# Patient Record
Sex: Male | Born: 1965 | Hispanic: Yes | Smoking: Never smoker
Health system: Southern US, Community
[De-identification: ages and names within clinical notes are randomized; demographics above are authoritative.]

---

## 2008-10-30 ENCOUNTER — Emergency Department: Payer: Self-pay | Admitting: Emergency Medicine

## 2016-08-18 ENCOUNTER — Emergency Department
Admission: EM | Admit: 2016-08-18 | Discharge: 2016-08-18 | Disposition: A | Discharge status: Home / Self Care | Payer: Self-pay | Attending: Emergency Medicine | Admitting: Emergency Medicine

## 2016-08-18 DIAGNOSIS — R252 Cramp and spasm: Secondary | ICD-10-CM | POA: Insufficient documentation

## 2016-08-18 LAB — COMPREHENSIVE METABOLIC PANEL
ALK PHOS: 67 U/L (ref 38–126)
ALT: 41 U/L (ref 17–63)
AST: 34 U/L (ref 15–41)
Albumin: 4 g/dL (ref 3.5–5.0)
Anion gap: 7 (ref 5–15)
BUN: 13 mg/dL (ref 6–20)
CHLORIDE: 107 mmol/L (ref 101–111)
CO2: 25 mmol/L (ref 22–32)
CREATININE: 1.01 mg/dL (ref 0.61–1.24)
Calcium: 8.3 mg/dL — ABNORMAL LOW (ref 8.9–10.3)
GFR calc Af Amer: >60 mL/min (ref >60)
Glucose, Bld: 128 mg/dL — ABNORMAL HIGH (ref 65–99)
Potassium: 3.8 mmol/L (ref 3.5–5.1)
Sodium: 139 mmol/L (ref 135–145)
Total Bilirubin: 0.3 mg/dL (ref 0.3–1.2)
Total Protein: 7 g/dL (ref 6.5–8.1)

## 2016-08-18 LAB — URINALYSIS, COMPLETE (UACMP) WITH MICROSCOPIC
Bacteria, UA: NONE SEEN
Bilirubin Urine: NEGATIVE
Glucose, UA: NEGATIVE mg/dL
Ketones, ur: NEGATIVE mg/dL
LEUKOCYTES UA: NEGATIVE
Nitrite: NEGATIVE
PH: 5 (ref 5.0–8.0)
Protein, ur: NEGATIVE mg/dL
SPECIFIC GRAVITY, URINE: 1.024 (ref 1.005–1.030)

## 2016-08-18 LAB — CBC
HCT: 43.3 % (ref 40.0–52.0)
Hemoglobin: 14.7 g/dL (ref 13.0–18.0)
MCH: 31 pg (ref 26.0–34.0)
MCHC: 33.9 g/dL (ref 32.0–36.0)
MCV: 91.5 fL (ref 80.0–100.0)
PLATELETS: 186 10*3/uL (ref 150–440)
RBC: 4.74 MIL/uL (ref 4.40–5.90)
RDW: 12.3 % (ref 11.5–14.5)
WBC: 4.9 10*3/uL (ref 3.8–10.6)

## 2016-08-18 LAB — LIPASE, BLOOD: LIPASE: 34 U/L (ref 11–51)

## 2016-08-18 MED ORDER — CYCLOBENZAPRINE HCL 7.5 MG PO TABS: 7.5000 mg | ORAL_TABLET | Freq: Three times a day (TID) | ORAL | 0 refills | Status: DC | PRN

## 2016-08-18 MED ORDER — KETOROLAC TROMETHAMINE 30 MG/ML IJ SOLN
30.0000 mg | Freq: Once | INTRAMUSCULAR | Status: AC
  Administered 2016-08-18: 30 mg via INTRAMUSCULAR
  Filled 2016-08-18: qty 1

## 2016-08-18 NOTE — ED Triage Notes (Signed)
Abdominal pain since Friday, pain described as pinching intermittently. Pt alert and oriented X4, active, cooperative, pt in NAD. RR even and unlabored, color WNL.

## 2016-08-18 NOTE — ED Provider Notes (Signed)
[facility] Emergency Department Provider Note   ____________________________________________    I have reviewed the triage vital signs and the nursing notes.   HISTORY  Chief Complaint "Pain all over "  Interpreter used  HPI Jayr Lupercio is a 51 y.o. male who presents with complaints of pain all over. Patient reports he has pain from his head all the way to his feet. This apparently started over the last 48 hours. He has never had this before. He is not able to specifically isolate where the pain is but says he hurts everywhere. No shortness of breath, no fevers or chills.   History reviewed. No pertinent past medical history.  There are no active problems to display for this patient.   History reviewed. No pertinent surgical history.  Prior to Admission medications   Medication Sig Start Date End Date Taking? Authorizing Provider  cyclobenzaprine (FEXMID) 7.5 MG tablet Take 1 tablet (7.5 mg total) by mouth 3 (three) times daily as needed for muscle spasms. 08/18/16   Jene Every, MD     Allergies Patient has no known allergies.  No family history on file.  Social History Social History  Substance Use Topics  . Smoking status: Never Smoker  . Smokeless tobacco: Not on file  . Alcohol use No    Review of Systems  Constitutional: No fever/chills  Cardiovascular: Denies chest pain. Respiratory: Denies shortness of breath. Gastrointestinal: No nausea, no vomiting.   Genitourinary: Negative for dysuria. Musculoskeletal: As above Skin: Negative for rash. Neurological: Negative for headaches   10-point ROS otherwise negative.  ____________________________________________   PHYSICAL EXAM:  VITAL SIGNS: ED Triage Vitals [08/18/16 1105]  Enc Vitals Group     BP 114/88     Pulse Rate 88     Resp 18     Temp 98 F (36.7 C)     Temp Source Oral     SpO2 95 %     Weight 180 lb (81.6 kg)     Height 5\' 8"  (1.727 m)   Head Circumference      Peak Flow      Pain Score 8     Pain Loc      Pain Edu?      Excl. in GC?     Constitutional: Alert and oriented. No acute distress. Pleasant and interactive Eyes: Conjunctivae are normal.   Nose: No congestion/rhinnorhea. Mouth/Throat: Mucous membranes are moist.   Neck:  Painless ROM Cardiovascular: Normal rate, regular rhythm. Grossly normal heart sounds.  Good peripheral circulation. Respiratory: Normal respiratory effort.  No retractions. Lungs CTAB. Gastrointestinal: Soft and nontender. No distention.  No CVA tenderness. Genitourinary: deferred Musculoskeletal: No lower extremity tenderness nor edema.  Warm and well perfused Neurologic:  Normal speech and language. No gross focal neurologic deficits are appreciated.  Skin:  Skin is warm, dry and intact. No rash noted. Psychiatric: Mood and affect are normal. Speech and behavior are normal.  ____________________________________________   LABS (all labs ordered are listed, but only abnormal results are displayed)  Labs Reviewed  COMPREHENSIVE METABOLIC PANEL - Abnormal; Notable for the following:       Result Value   Glucose, Bld 128 (*)    Calcium 8.3 (*)    All other components within normal limits  URINALYSIS, COMPLETE (UACMP) WITH MICROSCOPIC - Abnormal; Notable for the following:    Color, Urine YELLOW (*)    APPearance CLEAR (*)    Hgb urine dipstick SMALL (*)  Squamous Epithelial / LPF 0-5 (*)    All other components within normal limits  LIPASE, BLOOD  CBC   ____________________________________________  EKG  ED ECG REPORT I, Jene EveryKINNER, Kmarion Rawl, the attending physician, personally viewed and interpreted this ECG.  Date: 08/18/2016  Rate: 82 Rhythm: normal sinus rhythm QRS Axis: normal Intervals: normal ST/T Wave abnormalities: normal Conduction Disturbances: none Narrative Interpretation:  unremarkable  ____________________________________________  RADIOLOGY  None ____________________________________________   PROCEDURES  Procedure(s) performed: No    Critical Care performed: No ____________________________________________   INITIAL IMPRESSION / ASSESSMENT AND PLAN / ED COURSE  Pertinent labs & imaging results that were available during my care of the patient were reviewed by me and considered in my medical decision making (see chart for details).  Patient presents with complaints of leg pain all over. His exam is benign. Lab work is unremarkable. Vitals are normal. We will trial course of muscle relaxers and have the patient follow-up with PCP. Return precautions discussed.    ____________________________________________   FINAL CLINICAL IMPRESSION(S) / ED DIAGNOSES  Final diagnoses:  Muscle cramps      NEW MEDICATIONS STARTED DURING THIS VISIT:  Discharge Medication List as of 08/18/2016  1:05 PM    START taking these medications   Details  cyclobenzaprine (FEXMID) 7.5 MG tablet Take 1 tablet (7.5 mg total) by mouth 3 (three) times daily as needed for muscle spasms., Starting Tue 08/18/2016, Print         Note:  This document was prepared using Dragon voice recognition software and may include unintentional dictation errors.    Jene Everyobert Tasha Diaz, MD 08/18/16 1321

## 2016-08-18 NOTE — ED Notes (Signed)
Interpreter used for triage.  

## 2017-12-27 ENCOUNTER — Emergency Department: Payer: Self-pay

## 2017-12-27 ENCOUNTER — Encounter: Payer: Self-pay | Admitting: Emergency Medicine

## 2017-12-27 ENCOUNTER — Emergency Department
Admission: EM | Admit: 2017-12-27 | Discharge: 2017-12-28 | Disposition: A | Discharge status: Home / Self Care | Payer: Self-pay | Attending: Student in an Organized Health Care Education/Training Program | Admitting: Student in an Organized Health Care Education/Training Program

## 2017-12-27 DIAGNOSIS — M545 Low back pain, unspecified: Secondary | ICD-10-CM

## 2017-12-27 DIAGNOSIS — R1031 Right lower quadrant pain: Secondary | ICD-10-CM

## 2017-12-27 DIAGNOSIS — T148XXA Other injury of unspecified body region, initial encounter: Secondary | ICD-10-CM

## 2017-12-27 LAB — CBC
HCT: 39.5 % — ABNORMAL LOW (ref 40.0–52.0)
Hemoglobin: 13.3 g/dL (ref 13.0–18.0)
MCH: 30.5 pg (ref 26.0–34.0)
MCHC: 33.7 g/dL (ref 32.0–36.0)
MCV: 90.6 fL (ref 80.0–100.0)
PLATELETS: 312 10*3/uL (ref 150–440)
RBC: 4.35 MIL/uL — ABNORMAL LOW (ref 4.40–5.90)
RDW: 12.6 % (ref 11.5–14.5)
WBC: 13.1 10*3/uL — AB (ref 3.8–10.6)

## 2017-12-27 LAB — URINALYSIS, COMPLETE (UACMP) WITH MICROSCOPIC
BILIRUBIN URINE: NEGATIVE
Bacteria, UA: NONE SEEN
Glucose, UA: NEGATIVE mg/dL
HGB URINE DIPSTICK: NEGATIVE
KETONES UR: NEGATIVE mg/dL
LEUKOCYTES UA: NEGATIVE
NITRITE: NEGATIVE
Protein, ur: NEGATIVE mg/dL
SPECIFIC GRAVITY, URINE: 1.005 (ref 1.005–1.030)
pH: 6 (ref 5.0–8.0)

## 2017-12-27 LAB — BASIC METABOLIC PANEL
Anion gap: 9 (ref 5–15)
BUN: 17 mg/dL (ref 6–20)
CHLORIDE: 103 mmol/L (ref 101–111)
CO2: 26 mmol/L (ref 22–32)
CREATININE: 1.07 mg/dL (ref 0.61–1.24)
Calcium: 9.1 mg/dL (ref 8.9–10.3)
GFR calc Af Amer: >60 mL/min (ref >60)
GFR calc non Af Amer: >60 mL/min (ref >60)
Glucose, Bld: 97 mg/dL (ref 65–99)
Potassium: 3.6 mmol/L (ref 3.5–5.1)
SODIUM: 138 mmol/L (ref 135–145)

## 2017-12-27 MED ORDER — ONDANSETRON HCL 4 MG/2ML IJ SOLN
4.0000 mg | Freq: Once | INTRAMUSCULAR | Status: AC
  Administered 2017-12-27: 4 mg via INTRAVENOUS
  Filled 2017-12-27: qty 2

## 2017-12-27 MED ORDER — IOHEXOL 300 MG/ML  SOLN
100.0000 mL | Freq: Once | INTRAMUSCULAR | Status: AC | PRN
  Administered 2017-12-27: 100 mL via INTRAVENOUS

## 2017-12-27 MED ORDER — KETOROLAC TROMETHAMINE 30 MG/ML IJ SOLN
15.0000 mg | Freq: Once | INTRAMUSCULAR | Status: AC
  Administered 2017-12-27: 15 mg via INTRAVENOUS
  Filled 2017-12-27: qty 1

## 2017-12-27 MED ORDER — MORPHINE SULFATE (PF) 4 MG/ML IV SOLN
4.0000 mg | INTRAVENOUS | Status: DC | PRN
  Administered 2017-12-27: 4 mg via INTRAVENOUS
  Filled 2017-12-27: qty 1

## 2017-12-27 MED ORDER — SODIUM CHLORIDE 0.9 % IV BOLUS
1000.0000 mL | Freq: Once | INTRAVENOUS | Status: AC
Start: 2017-12-27 — End: 2017-12-28
  Administered 2017-12-27: 1000 mL via INTRAVENOUS

## 2017-12-27 NOTE — ED Provider Notes (Signed)
[facility] Emergency Department Provider Note    First MD Initiated Contact with Patient 12/27/17 2148     (approximate)  I have reviewed the triage vital signs and the nursing notes.   HISTORY  Chief Complaint Back Pain    HPI Kermit Arnette is a 52 y.o. male no significant past medical history presents the ER with 8 days of progressively worsening right flank pain as well as right lower quadrant pain.  No measured fevers.  Has had some nausea with vomiting.  Denies any dysuria or hematuria.  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  Denies any chest pain or shortness of breath.  He rates the pain is moderate to severe.    History reviewed. No pertinent past medical history. History reviewed. No pertinent family history. History reviewed. No pertinent surgical history. There are no active problems to display for this patient.     Prior to Admission medications   Medication Sig Start Date End Date Taking? Authorizing Provider  cyclobenzaprine (FEXMID) 7.5 MG tablet Take 1 tablet (7.5 mg total) by mouth 3 (three) times daily as needed for muscle spasms. 08/18/16   Jene Every, MD    Allergies Patient has no known allergies.    Social History Social History   Tobacco Use  . Smoking status: Never Smoker  . Smokeless tobacco: Never Used  Substance Use Topics  . Alcohol use: No  . Drug use: Not on file    Review of Systems Patient denies headaches, rhinorrhea, blurry vision, numbness, shortness of breath, chest pain, edema, cough, abdominal pain, nausea, vomiting, diarrhea, dysuria, fevers, rashes or hallucinations unless otherwise stated above in HPI. ____________________________________________   PHYSICAL EXAM:  VITAL SIGNS: Vitals:   12/27/17 2027  BP: (!) 148/88  Pulse: 78  Resp: 17  Temp: 98 F (36.7 C)  SpO2: 99%    Constitutional: Alert and oriented.  Eyes:  Conjunctivae are normal.  Head: Atraumatic. Nose: No congestion/rhinnorhea. Mouth/Throat: Mucous membranes are moist.   Neck: No stridor. Painless ROM.  Cardiovascular: Normal rate, regular rhythm. Grossly normal heart sounds.  Good peripheral circulation. Respiratory: Normal respiratory effort.  No retractions. Lungs CTAB. Gastrointestinal: Soft with ttp in RLQ. No distention. No abdominal bruits. + right CVA tenderness. Genitourinary: deferred Musculoskeletal: No lower extremity tenderness nor edema.  No joint effusions. Neurologic:  Normal speech and language. No gross focal neurologic deficits are appreciated. No facial droop Skin:  Skin is warm, dry and intact. No rash noted. Psychiatric: Mood and affect are normal. Speech and behavior are normal.  ____________________________________________   LABS (all labs ordered are listed, but only abnormal results are displayed)  Results for orders placed or performed during the hospital encounter of 12/27/17 (from the past 24 hour(s))  Urinalysis, Complete w Microscopic     Status: Abnormal   Collection Time: 12/27/17  8:32 PM  Result Value Ref Range   Color, Urine STRAW (A) YELLOW   APPearance CLEAR (A) CLEAR   Specific Gravity, Urine 1.005 1.005 - 1.030   pH 6.0 5.0 - 8.0   Glucose, UA NEGATIVE NEGATIVE mg/dL   Hgb urine dipstick NEGATIVE NEGATIVE   Bilirubin Urine NEGATIVE NEGATIVE   Ketones, ur NEGATIVE NEGATIVE mg/dL   Protein, ur NEGATIVE NEGATIVE mg/dL   Nitrite NEGATIVE NEGATIVE   Leukocytes, UA NEGATIVE NEGATIVE   RBC / HPF 0-5 0 - 5 RBC/hpf   WBC, UA 0-5 0 - 5 WBC/hpf  Bacteria, UA NONE SEEN NONE SEEN   Squamous Epithelial / LPF 0-5 0 - 5  Basic metabolic panel     Status: None   Collection Time: 12/27/17  8:33 PM  Result Value Ref Range   Sodium 138 135 - 145 mmol/L   Potassium 3.6 3.5 - 5.1 mmol/L   Chloride 103 101 - 111 mmol/L   CO2 26 22 - 32 mmol/L   Glucose, Bld 97 65 - 99 mg/dL   BUN 17 6 - 20 mg/dL    Creatinine, Ser 1.61 0.61 - 1.24 mg/dL   Calcium 9.1 8.9 - 09.6 mg/dL   GFR calc non Af Amer >60 >60 mL/min   GFR calc Af Amer >60 >60 mL/min   Anion gap 9 5 - 15  CBC     Status: Abnormal   Collection Time: 12/27/17  8:33 PM  Result Value Ref Range   WBC 13.1 (H) 3.8 - 10.6 K/uL   RBC 4.35 (L) 4.40 - 5.90 MIL/uL   Hemoglobin 13.3 13.0 - 18.0 g/dL   HCT 04.5 (L) 40.9 - 81.1 %   MCV 90.6 80.0 - 100.0 fL   MCH 30.5 26.0 - 34.0 pg   MCHC 33.7 32.0 - 36.0 g/dL   RDW 91.4 78.2 - 95.6 %   Platelets 312 150 - 440 K/uL   ____________________________________________ ____________________________________________  RADIOLOGY  I personally reviewed all radiographic images ordered to evaluate for the above acute complaints and reviewed radiology reports and findings.  These findings were personally discussed with the patient.  Please see medical record for radiology report.  ____________________________________________   PROCEDURES  Procedure(s) performed:  Procedures    Critical Care performed: no ____________________________________________   INITIAL IMPRESSION / ASSESSMENT AND PLAN / ED COURSE  Pertinent labs & imaging results that were available during my care of the patient were reviewed by me and considered in my medical decision making (see chart for details).   DDX: appy, stone, pyelo, msk strain  Jarvin Ogren is a 52 y.o. who presents to the ED with symptoms as descrbied above.  Patient is AFVSS in ED. Exam as above. Given current presentation have considered the above differential.  Blood work does show evidence of ptosis.  CT imaging will be ordered to evaluate for the above differential.  Will provide IV pain medication and IV fluids.  Patient denies any testicular pain.  Possible stone.  Possible musculoskeletal strain.  Clinical Course as of Dec 28 5  Mon Dec 27, 2017  2350 Patient still with some tenderness on exam.  Does feel improved.  Seems less likely  appendicitis but patient does have white count and no other explanation for his right lower quadrant abdominal pain.  No evidence of UTI or kidney stone.  No symptoms to suggest urethritis or testicular pain.  Spoke with Dr. Maia Plan of general surgery who currently agrees to evaluate patient at bedside.  Patient will be signed out to Dr. sung pending consultation.  Have discussed with the patient and available family all diagnostics and treatments performed thus far and all questions were answered to the best of my ability. The patient demonstrates understanding and agreement with plan.    [PR]    Clinical Course User Index [PR] Willy Eddy, MD     As part of my medical decision making, I reviewed the following data within the electronic MEDICAL RECORD NUMBER Nursing notes reviewed and incorporated, Labs reviewed, notes from prior ED visits and [place] Controlled Substance Database  ____________________________________________   FINAL CLINICAL IMPRESSION(S) / ED DIAGNOSES  Final diagnoses:  Right lower quadrant abdominal pain      NEW MEDICATIONS STARTED DURING THIS VISIT:  New Prescriptions   No medications on file     Note:  This document was prepared using Dragon voice recognition software and may include unintentional dictation errors.    Willy Eddyobinson, Zaiyden Strozier, MD 12/28/17 [phone]

## 2017-12-27 NOTE — ED Triage Notes (Signed)
Pt c/o right lower back pain that radiates into right flank x8 days. Pt denies n/v. Pt denies urinary symptoms.

## 2017-12-28 LAB — CHLAMYDIA/NGC RT PCR (ARMC ONLY)
Chlamydia Tr: NOT DETECTED
N GONORRHOEAE: NOT DETECTED

## 2017-12-28 MED ORDER — IBUPROFEN 800 MG PO TABS: 800.0000 mg | ORAL_TABLET | Freq: Three times a day (TID) | ORAL | 0 refills | Status: DC | PRN

## 2017-12-28 MED ORDER — CYCLOBENZAPRINE HCL 5 MG PO TABS: ORAL_TABLET | ORAL | 0 refills | Status: DC

## 2017-12-28 NOTE — Consult Note (Signed)
SURGICAL CONSULTATION NOTE   HISTORY OF PRESENT ILLNESS (HPI):  52 y.o. male presented to [facility] ED for evaluation of lower back pain. Patient reports started with significant back pain and headache 8 days ago at work. At the end of the day pain was very severe on the right lower back. Patient refers that pain does not radiates to other part of the body. Pain is aggravated with waking and certain movements. If patient is lying flat, pain is "calmed". Patient do refers pain on both abdominal lower quadrant more on the left than on the right. Refers that this pain is not as bad as the back pain and does not stop him from work as the back pain. Patient refers that the lower quadrant pain is not associated with the back pain, which he consider are two different pains. Pain has been improved with pain medication here at the ED (patient does not what medications he has received). Denies nausea. Patient also refers has had diarrhea during the week. Denies fever or chills.  Surgery is consulted by Dr. Roxan Hockeyobinson in this context for evaluation and management of suspected appendicitis.  PAST MEDICAL HISTORY (PMH):  History reviewed. No pertinent past medical history.   PAST SURGICAL HISTORY (PSH):  History reviewed. No pertinent surgical history.   MEDICATIONS:  Prior to Admission medications   Medication Sig Start Date End Date Taking? Authorizing Provider  cyclobenzaprine (FEXMID) 7.5 MG tablet Take 1 tablet (7.5 mg total) by mouth 3 (three) times daily as needed for muscle spasms. 08/18/16   Jene EveryKinner, Robert, MD     ALLERGIES:  No Known Allergies   SOCIAL HISTORY:  Social History   Socioeconomic History  . Marital status: Divorced    Spouse name: Not on file  . Number of children: Not on file  . Years of education: Not on file  . Highest education level: Not on file  Occupational History  . Not on file  Social Needs  . Financial resource strain: Not on file  . Food insecurity:    Worry: Not  on file    Inability: Not on file  . Transportation needs:    Medical: Not on file    Non-medical: Not on file  Tobacco Use  . Smoking status: Never Smoker  . Smokeless tobacco: Never Used  Substance and Sexual Activity  . Alcohol use: No  . Drug use: Not on file  . Sexual activity: Not on file  Lifestyle  . Physical activity:    Days per week: Not on file    Minutes per session: Not on file  . Stress: Not on file  Relationships  . Social connections:    Talks on phone: Not on file    Gets together: Not on file    Attends religious service: Not on file    Active member of club or organization: Not on file    Attends meetings of clubs or organizations: Not on file    Relationship status: Not on file  . Intimate partner violence:    Fear of current or ex partner: Not on file    Emotionally abused: Not on file    Physically abused: Not on file    Forced sexual activity: Not on file  Other Topics Concern  . Not on file  Social History Narrative  . Not on file    The patient currently resides (home / rehab facility / nursing home): Home The patient normally is (ambulatory / bedbound): Ambulatory   FAMILY  HISTORY:  History reviewed. No pertinent family history.   REVIEW OF SYSTEMS:  Constitutional: denies weight loss, fever, chills, or sweats  Eyes: denies any other vision changes, history of eye injury  ENT: denies sore throat, hearing problems  Respiratory: denies shortness of breath, wheezing  Cardiovascular: denies chest pain, palpitations  Gastrointestinal: See HPI Genitourinary: denies burning with urination or urinary frequency Musculoskeletal: denies any other joint pains or cramps. Positive for severe back pain.  Skin: denies any other rashes or skin discolorations  Neurological: denies any other headache, dizziness, weakness  Psychiatric: denies any other depression, anxiety   All other review of systems were negative   VITAL SIGNS:  Temp:  [98 F (36.7  C)] 98 F (36.7 C) (06/10 2027) Pulse Rate:  [78] 78 (06/10 2027) Resp:  [17] 17 (06/10 2027) BP: (148)/(88) 148/88 (06/10 2027) SpO2:  [99 %] 99 % (06/10 2027) Weight:  [81.6 kg (180 lb)] 81.6 kg (180 lb) (06/10 2026)       Weight: 81.6 kg (180 lb)     INTAKE/OUTPUT:  This shift: No intake/output data recorded.  Last 2 shifts: @IOLAST2SHIFTS @   PHYSICAL EXAM:  Constitutional:  -- Normal body habitus  -- Awake, alert, and oriented x3  Eyes:  -- Pupils equally round and reactive to light  -- No scleral icterus  Ear, nose, and throat:  -- No jugular venous distension  Pulmonary:  -- No crackles  -- Equal breath sounds bilaterally -- Breathing non-labored at rest Cardiovascular:  -- S1, S2 present  -- No pericardial rubs Gastrointestinal:  -- Abdomen soft, tender to palpation on both lower quadrants Left > Right, non-distended, no guarding or rebound tenderness -- No abdominal masses appreciated, pulsatile or otherwise  Musculoskeletal and Integumentary:  -- Wounds or skin discoloration: None appreciated -- Extremities: B/L UE and LE FROM, hands and feet warm, no edema -- Tender to palpation on lower back, more on the right than on the left.  Neurologic:  -- Motor function: intact and symmetric -- Sensation: intact and symmetric   Labs:  CBC Latest Ref Rng & Units 12/27/2017 08/18/2016  WBC 3.8 - 10.6 K/uL 13.1(H) 4.9  Hemoglobin 13.0 - 18.0 g/dL 78.2 95.6  Hematocrit 21.3 - 52.0 % 39.5(L) 43.3  Platelets 150 - 440 K/uL 312 186   CMP Latest Ref Rng & Units 12/27/2017 08/18/2016  Glucose 65 - 99 mg/dL 97 086(V)  BUN 6 - 20 mg/dL 17 13  Creatinine 7.84 - 1.24 mg/dL 6.96 2.95  Sodium 284 - 145 mmol/L 138 139  Potassium 3.5 - 5.1 mmol/L 3.6 3.8  Chloride 101 - 111 mmol/L 103 107  CO2 22 - 32 mmol/L 26 25  Calcium 8.9 - 10.3 mg/dL 9.1 1.3(K)  Total Protein 6.5 - 8.1 g/dL - 7.0  Total Bilirubin 0.3 - 1.2 mg/dL - 0.3  Alkaline Phos 38 - 126 U/L - 67  AST 15 - 41 U/L - 34   ALT 17 - 63 U/L - 41   Imaging studies:  CT ABDOMEN AND PELVIS WITH CONTRAST  TECHNIQUE: Multidetector CT imaging of the abdomen and pelvis was performed using the standard protocol following bolus administration of intravenous contrast.  CONTRAST:  OMNIPAQUE IOHEXOL 300 MG/ML  SOLN  COMPARISON:  None.  FINDINGS: Lower chest: Hypoventilatory atelectasis at the lung bases. No pleural fluid.  Hepatobiliary: Decreased hepatic density consistent with steatosis. Focal fatty sparing adjacent the gallbladder fossa. The liver is enlarged spanning 21 cm cranial caudal. No discrete focal  hepatic lesion. Gallbladder partially distended, no calcified gallstone or pericholecystic inflammation. No biliary dilatation.  Pancreas: No ductal dilatation or inflammation.  Spleen: Normal in size without focal abnormality.  Adrenals/Urinary Tract: Normal adrenal glands. No hydronephrosis or perinephric edema. Homogeneous renal enhancement with symmetric excretion on delayed phase imaging. Urinary bladder is physiologically distended without wall thickening. Punctate wall calcification of the bladder dome, likely incidental.  Stomach/Bowel: No bowel wall thickening, inflammatory change or obstruction. Equivocal gastric wall thickening about the greater curvature versus nondistention. Appendix is normal in caliber without periappendiceal inflammation. Intraluminal high density about the appendiceal tip measuring 4 mm and more proximally measuring 8 mm may be enteric contrast patient has had prior contrast-enhanced exam or appendicoliths. Moderate stool burden throughout the colon. No colonic inflammation. No significant diverticular disease.  Vascular/Lymphatic: Minimal aortic atherosclerosis without aneurysm. Portal vein and mesenteric vessels are patent. No acute vascular findings. Prominent right external iliac node measures 10 mm short axis, nonspecific.  Reproductive:  Slight prostate prominence for age, spanning 4.8 cm transverse.  Other: No free air, free fluid, or intra-abdominal fluid collection.  Musculoskeletal: Mild disc space narrowing and endplate spurring at L5-S1. There are no acute or suspicious osseous abnormalities.  IMPRESSION: 1. Equivocal gastric wall thickening about the greater curvature versus nondistention, gastritis is considered. No other acute findings in the abdomen/pelvis. 2. Hepatomegaly and hepatic steatosis. 3. High-density material within otherwise normal appendix may represent barium from a prior radiologic exam or appendicoliths. No periappendiceal inflammation. 4. Prominent right external iliac node measuring 10 mm is nonspecific. In the absence of known malignancy this is likely reactive.  Electronically Signed   By: Rubye Oaks M.D.   On: 12/27/2017 23:26  Images personally reviewed identifying the appendix without fat stranding, no dilation. No bowel obstruction or any pathology that explain the lower quadrant pains, nor the back pain.  Assessment/Plan:  52 y.o. male with lower back pain and lower quadrant pain of 8 days duration. Upon physical exam there is no multiple point of tenderness including left lower quadrant > than right lower quadrant and lower back. Images show the normal appendix without associated fat stranding. Clinical scenario is not consistent with acute appendicitis. Differential diagnosis before appendicitis could be musculoskeletal back pain, gastroenteritis (dirrhea), nephrolithiasis or a combination of problems. Leukocytosis may also be due to dehydration (patient refers has not eaten well this week due to diarrhea). I agree with observation of patient as pain is improving with current pain management and considering other ethiologies of back and abdominal pain.    Gae Gallop, MD

## 2017-12-28 NOTE — ED Provider Notes (Signed)
-----------------------------------------   1:14 AM on 12/28/2017 -----------------------------------------  Patient was evaluated by Dr. Maia Planintron from surgery.  Does not feel patient's symptoms are clinically consistent with appendicitis.  Patient is overall feeling better after IV Toradol.  Awaiting results of GC/chlamydia.   ----------------------------------------- 1:53 AM on 12/28/2017 -----------------------------------------  GC/Chlamydia are negative.  Patient resting no acute distress.  Will prescribe NSAIDs and muscle relaxer and patient will follow-up with his PCP.  Strict return precautions given.  Patient and friend verbalize understanding and agree with plan of care.   Irean HongSung, Kaci Dillie J, MD 12/28/17 [phone]

## 2017-12-28 NOTE — Discharge Instructions (Addendum)
1.  You may take medicines as needed for pain and muscle spasms (Motrin/Flexeril #15). °2.  Apply moist heat to affected area several times daily. °3.  Return to the ER for worsening symptoms, persistent vomiting, difficulty breathing or other concerns. °

## 2017-12-29 ENCOUNTER — Emergency Department: Payer: Self-pay

## 2017-12-29 ENCOUNTER — Other Ambulatory Visit: Payer: Self-pay

## 2017-12-29 ENCOUNTER — Emergency Department
Admission: EM | Admit: 2017-12-29 | Discharge: 2017-12-29 | Disposition: A | Discharge status: Home / Self Care | Payer: Self-pay | Attending: Emergency Medicine | Admitting: Emergency Medicine

## 2017-12-29 ENCOUNTER — Encounter: Payer: Self-pay | Admitting: Emergency Medicine

## 2017-12-29 DIAGNOSIS — Z79899 Other long term (current) drug therapy: Secondary | ICD-10-CM | POA: Insufficient documentation

## 2017-12-29 DIAGNOSIS — M5431 Sciatica, right side: Secondary | ICD-10-CM | POA: Insufficient documentation

## 2017-12-29 MED ORDER — METHYLPREDNISOLONE SODIUM SUCC 125 MG IJ SOLR
80.0000 mg | Freq: Once | INTRAMUSCULAR | Status: AC
  Administered 2017-12-29: 80 mg via INTRAMUSCULAR
  Filled 2017-12-29: qty 2

## 2017-12-29 MED ORDER — TRAMADOL HCL 50 MG PO TABS: 50.0000 mg | ORAL_TABLET | Freq: Two times a day (BID) | ORAL | 0 refills | Status: AC | PRN

## 2017-12-29 MED ORDER — METHYLPREDNISOLONE 4 MG PO TBPK: ORAL_TABLET | ORAL | 0 refills | Status: DC

## 2017-12-29 MED ORDER — HYDROMORPHONE HCL 1 MG/ML IJ SOLN
1.0000 mg | Freq: Once | INTRAMUSCULAR | Status: AC
  Administered 2017-12-29: 1 mg via INTRAMUSCULAR
  Filled 2017-12-29: qty 1

## 2017-12-29 NOTE — ED Provider Notes (Signed)
[facility] Emergency Department Provider Note   ____________________________________________   First MD Initiated Contact with Patient 12/29/17 1218     (approximate)  I have reviewed the triage vital signs and the nursing notes.   HISTORY Via telephonic interpreter. Chief Complaint Back Pain    HPI Lance Fields is a 52 y.o. male patient complain of back pain with radicular component to the right lower extremity for 10 days.  Patient was seen at this facility 2 days ago and had a definitive work-up for abdominal pain.  Patient state he did mention back pain at that time.  Patient was consulted with surgeon who stated there appears to be no acute abdominal process.  Patient state received transit relief with Toradol IM injection.  Patient was discharged prescription for Flexeril and ibuprofen.  Patient stated no relief with these medications.  Patient not return to work secondary to back pain.  Patient denies any abdominal complaints at this time.  Patient rates the pain as a 9/10.  Patient described the pain is "shooting".   History reviewed. No pertinent past medical history.  There are no active problems to display for this patient.   History reviewed. No pertinent surgical history.  Prior to Admission medications   Medication Sig Start Date End Date Taking? Authorizing Provider  cyclobenzaprine (FLEXERIL) 5 MG tablet 1 tablet every 8 hours as he did for muscle spasms 12/28/17   Irean HongSung, Jade J, MD  ibuprofen (ADVIL,MOTRIN) 800 MG tablet Take 1 tablet (800 mg total) by mouth every 8 (eight) hours as needed for moderate pain. 12/28/17   Irean HongSung, Jade J, MD  methylPREDNISolone (MEDROL DOSEPAK) 4 MG TBPK tablet Take Tapered dose as directed 12/29/17   Joni ReiningSmith, Nicholas Trompeter K, PA-C  traMADol (ULTRAM) 50 MG tablet Take 1 tablet (50 mg total) by mouth every 12 (twelve) hours as needed. 12/29/17   Joni ReiningSmith, Idalee Foxworthy K, PA-C    Allergies Patient has no known  allergies.  History reviewed. No pertinent family history.  Social History Social History   Tobacco Use  . Smoking status: Never Smoker  . Smokeless tobacco: Never Used  Substance Use Topics  . Alcohol use: No  . Drug use: Not on file    Review of Systems Constitutional: No fever/chills Eyes: No visual changes. ENT: No sore throat. Cardiovascular: Denies chest pain. Respiratory: Denies shortness of breath. Gastrointestinal: No abdominal pain.  No nausea, no vomiting.  No diarrhea.  No constipation. Genitourinary: Negative for dysuria. Musculoskeletal: Positive for back pain. Skin: Negative for rash. Neurological: Negative for headaches, focal weakness or numbness.   ____________________________________________   PHYSICAL EXAM:  VITAL SIGNS: ED Triage Vitals  Enc Vitals Group     BP 12/29/17 1128 127/73     Pulse Rate 12/29/17 1128 80     Resp 12/29/17 1128 16     Temp 12/29/17 1128 97.9 F (36.6 C)     Temp Source 12/29/17 1128 Oral     SpO2 12/29/17 1128 99 %     Weight 12/29/17 1124 180 lb (81.6 kg)     Height 12/29/17 1124 5\' 8"  (1.727 m)     Head Circumference --      Peak Flow --      Pain Score 12/29/17 1124 9     Pain Loc --      Pain Edu? --      Excl. in GC? --     Constitutional: Alert and oriented.  Moderate distress.   Cardiovascular: Normal  rate, regular rhythm. Grossly normal heart sounds.  Good peripheral circulation. Respiratory: Normal respiratory effort.  No retractions. Lungs CTAB. Gastrointestinal: Soft and nontender. No distention. No abdominal bruits. No CVA tenderness. Musculoskeletal: No obvious spinal deformity.  Patient states in the flex position while standing.  Patient has moderate guarding palpation of L4-S1.  Patient is straight leg test on the right side. Neurologic:  Normal speech and language. No gross focal neurologic deficits are appreciated. No gait instability. Skin:  Skin is warm, dry and intact. No rash  noted. Psychiatric: Mood and affect are normal. Speech and behavior are normal.  ____________________________________________   LABS (all labs ordered are listed, but only abnormal results are displayed)  Labs Reviewed - No data to display ____________________________________________  EKG   ____________________________________________  RADIOLOGY     Official radiology report(s): Dg Lumbar Spine Complete  Result Date: 12/29/2017 CLINICAL DATA:  Right lower back pain radiating to the right leg for 10 days. EXAM: LUMBAR SPINE - COMPLETE 4+ VIEW COMPARISON:  12/27/2017 CT abdomen and pelvis FINDINGS: There are 5 non rib-bearing lumbar type vertebrae. Vertebral alignment is normal. Mild-to-moderate disc space narrowing is present at L5-S1 with associated endplate spurring. Minimal spondylosis is again seen elsewhere in the lumbar spine with preserved disc space heights. Subtle anterior wedging of the L4 vertebral body is unchanged and likely chronic. Moderate L5-S1 facet arthrosis is noted and contributes to bilateral neural foraminal stenosis on CT. IMPRESSION: L5-S1 disc and facet degeneration without evidence of acute osseous abnormality. Electronically Signed   By: Sebastian Ache M.D.   On: 12/29/2017 13:10    ____________________________________________   PROCEDURES  Procedure(s) performed: None  Procedures  Critical Care performed: No  ____________________________________________   INITIAL IMPRESSION / ASSESSMENT AND PLAN / ED COURSE  As part of my medical decision making, I reviewed the following data within the electronic MEDICAL RECORD NUMBER    Radicular back pain secondary to degenerative changes of L4 S1.  Discussed x-ray findings with patient.  Patient given discharge care instruction work note.  Patient advised take medication as directed and [facility]      ____________________________________________   FINAL  CLINICAL IMPRESSION(S) / ED DIAGNOSES  Final diagnoses:  Sciatica of right side     ED Discharge Orders        Ordered    traMADol (ULTRAM) 50 MG tablet  Every 12 hours PRN     12/29/17 1326    methylPREDNISolone (MEDROL DOSEPAK) 4 MG TBPK tablet     12/29/17 1326       Note:  This document was prepared using Dragon voice recognition software and may include unintentional dictation errors.    Joni Reining, PA-C 12/29/17 1328    Sharman Cheek, MD 12/29/17 [phone]

## 2017-12-29 NOTE — ED Triage Notes (Signed)
Pt with c/o back pain. [facility]. Pt does not speak spanish

## 2017-12-29 NOTE — ED Notes (Addendum)
See triage note  [facility]  Was seen here on 6/10   For same  States back pain and headache started about 8 days ago  Also has had some pain into abd  Pain increases with ambulation  No urinary or bowel problems

## 2017-12-29 NOTE — ED Triage Notes (Signed)
Pt reports that he was here on Monday with back pain, they gave him Flexeril and Motrin, states he does not have any relief. Denies any injury, loss of bowel and bladder. Denies any blood in his urine.

## 2017-12-29 NOTE — ED Notes (Signed)
States pain is right lower back and into right leg  Denies any n/v   States pain started about 10 days ago

## 2018-01-17 ENCOUNTER — Emergency Department
Admission: EM | Admit: 2018-01-17 | Discharge: 2018-01-17 | Disposition: A | Discharge status: Home / Self Care | Payer: Self-pay | Attending: Emergency Medicine | Admitting: Emergency Medicine

## 2018-01-17 DIAGNOSIS — M5441 Lumbago with sciatica, right side: Secondary | ICD-10-CM | POA: Insufficient documentation

## 2018-01-17 DIAGNOSIS — G8929 Other chronic pain: Secondary | ICD-10-CM

## 2018-01-17 MED ORDER — ORPHENADRINE CITRATE 30 MG/ML IJ SOLN
60.0000 mg | INTRAMUSCULAR | Status: AC
Start: 2018-01-17 — End: 2018-01-17
  Administered 2018-01-17: 60 mg via INTRAMUSCULAR
  Filled 2018-01-17: qty 2

## 2018-01-17 MED ORDER — KETOROLAC TROMETHAMINE 30 MG/ML IJ SOLN
30.0000 mg | Freq: Once | INTRAMUSCULAR | Status: AC
  Administered 2018-01-17: 30 mg via INTRAMUSCULAR
  Filled 2018-01-17: qty 1

## 2018-01-17 MED ORDER — CYCLOBENZAPRINE HCL 5 MG PO TABS: 5.0000 mg | ORAL_TABLET | Freq: Three times a day (TID) | ORAL | 1 refills | Status: AC | PRN

## 2018-01-17 MED ORDER — DICLOFENAC SODIUM 50 MG PO TBEC: 50.0000 mg | DELAYED_RELEASE_TABLET | Freq: Two times a day (BID) | ORAL | 0 refills | Status: AC

## 2018-01-17 NOTE — ED Notes (Signed)
See triage note  Presents with lower back pain  States pain is mainly on thr right and moves into right leg  Ambulates well to treatment room  Was seen on 6/12 for same  But conts to have pain over the past 7 days   Also has a rash to legs,arms and truck since last Thursday  Positive itching  No resp issues

## 2018-01-17 NOTE — ED Provider Notes (Signed)
[facility] Emergency Department Provider Note ____________________________________________  Time seen: 1015  I have reviewed the triage vital signs and the nursing notes.  HISTORY  Chief Complaint  Back Pain  History limited by Spanish language.  [employer] interpreter Debarah Crape) used for interview and exam.  HPI Lance Fields is a 52 y.o. male presents to the ED, dropped off by family, for evaluation of his chronic intermittent low back pain.  Patient describes pain to the right lumbar sacral region with referral down the right side to the knee.  He denies any recent injury, accident, or trauma.  He was recently evaluated here in the ED for the same complaint, and reports that the prescriptions provided have now run out.  He denies any interim trauma, injury, accident, fall.  He also denies any foot drop, incontinence, or leg weakness.  History reviewed. No pertinent past medical history.  There are no active problems to display for this patient.  History reviewed. No pertinent surgical history.  Prior to Admission medications   Medication Sig Start Date End Date Taking? Authorizing Provider  cyclobenzaprine (FLEXERIL) 5 MG tablet Take 1 tablet (5 mg total) by mouth 3 (three) times daily as needed for muscle spasms. 01/17/18 02/16/18  Mayco Walrond, Charlesetta Ivory, PA-C  diclofenac (VOLTAREN) 50 MG EC tablet Take 1 tablet (50 mg total) by mouth 2 (two) times daily. 01/17/18 02/16/18  Gearlene Godsil, Charlesetta Ivory, PA-C  traMADol (ULTRAM) 50 MG tablet Take 1 tablet (50 mg total) by mouth every 12 (twelve) hours as needed. 12/29/17   Joni Reining, PA-C    Allergies Patient has no known allergies.  No family history on file.  Social History Social History   Tobacco Use  . Smoking status: Never Smoker  . Smokeless tobacco: Never Used  Substance Use Topics  . Alcohol use: No  . Drug use: Not on file    Review of Systems  Constitutional: Negative for  fever. Cardiovascular: Negative for chest pain. Respiratory: Negative for shortness of breath. Gastrointestinal: Negative for abdominal pain, vomiting and diarrhea. Genitourinary: Negative for dysuria. Musculoskeletal: Positive for back pain. Skin: Negative for rash. Neurological: Negative for headaches, focal weakness or numbness. ____________________________________________  PHYSICAL EXAM:  VITAL SIGNS: ED Triage Vitals [01/17/18 0943]  Enc Vitals Group     BP (!) 155/79     Pulse Rate 76     Resp 14     Temp 98.2 F (36.8 C)     Temp Source Oral     SpO2 97 %     Weight 180 lb (81.6 kg)     Height 5\' 8"  (1.727 m)     Head Circumference      Peak Flow      Pain Score 7     Pain Loc      Pain Edu?      Excl. in GC?     Constitutional: Alert and oriented. Well appearing and in no distress. Head: Normocephalic and atraumatic. Cardiovascular: Normal rate, regular rhythm. Normal distal pulses. Respiratory: Normal respiratory effort. No wheezes/rales/rhonchi. Gastrointestinal: Soft and nontender. No distention. Musculoskeletal: Normal spinal alignment without midline tenderness, spasm, deformity, step-off. Nontender with normal range of motion in all extremities.  Neurologic: Nerves II through XII grossly intact.  Negative seated straight leg raise.  Normal toe/heel walk on exam.  Negative Trendelenburg.  Normal gait without ataxia. Normal speech and language. No gross focal neurologic deficits are appreciated. Skin:  Skin is warm, dry and intact.  No rash noted. ____________________________________________  PROCEDURES  Procedures Toradol 30 mg IM Norflex 60 mg IM ____________________________________________  INITIAL IMPRESSION / ASSESSMENT AND PLAN / ED COURSE  Patient with ED evaluation of chronic low back pain.  Patient with a history of sciatica presents for further evaluation and management.  Exam is benign overall shows no acute neuromuscular damage.  He will be  discharged with prescriptions for cyclobenzaprine and diclofenac dose as directed.  He is referred to follow-up with 1 of the local community clinics for ongoing symptoms.  Return precautions have been reviewed. ____________________________________________  FINAL CLINICAL IMPRESSION(S) / ED DIAGNOSES  Final diagnoses:  Chronic right-sided low back pain with right-sided sciatica      Lissa HoardMenshew, Daryan Cagley V Bacon, PA-C 01/17/18 1857    Emily FilbertWilliams, Jonathan E, MD 01/18/18 [phone]

## 2018-01-17 NOTE — Discharge Instructions (Addendum)
Tome los medicamentos recetados segn las indicaciones. Haga un seguimiento con una de las clnicas de la comunidad local en la lista provista.  Take the prescription meds as directed. Follow-up with one of the local community clinics on the list provided.

## 2018-01-17 NOTE — ED Notes (Addendum)
First Nurse Note:  Patient does not speak English, Nurse, children'sVirtual Interpreter used to ascertain that patient is here for a recheck of pain.  Has discharge paperwork with him from 12/27/17 and 12/29/17 and a copy of a neck film.

## 2018-01-17 NOTE — ED Triage Notes (Signed)
Pt to ER via POV c/o chronic back pain, states "I don't have any medications". Pt also rash to bilateral arms, legs and trunk since Thursday, itching.   [employer] interpreter used

## 2020-04-16 IMAGING — CR DG LUMBAR SPINE COMPLETE 4+V
5 series · 5 of 5 positions shown · non-contrast
Comparison: 12/27/2017 CT abdomen and pelvis

CLINICAL DATA: Right lower back pain radiating to the right leg for
10 days.

EXAM:
LUMBAR SPINE - COMPLETE 4+ VIEW

[l-spine ap]
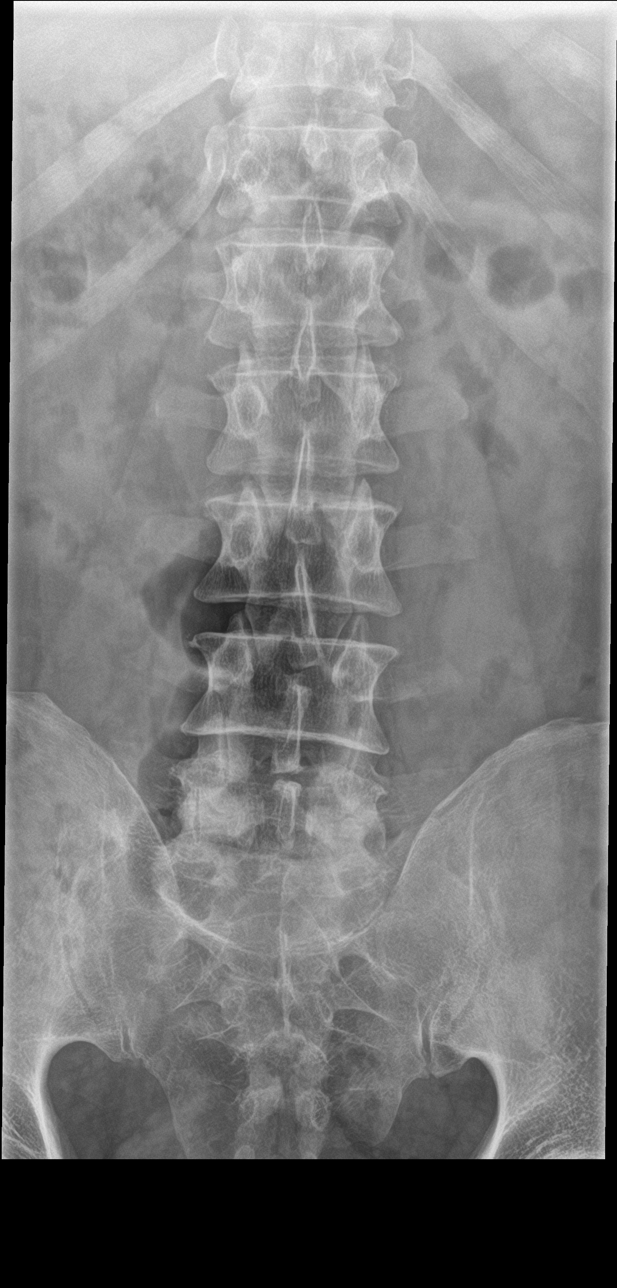

[l-spine obl (1 of 2)]
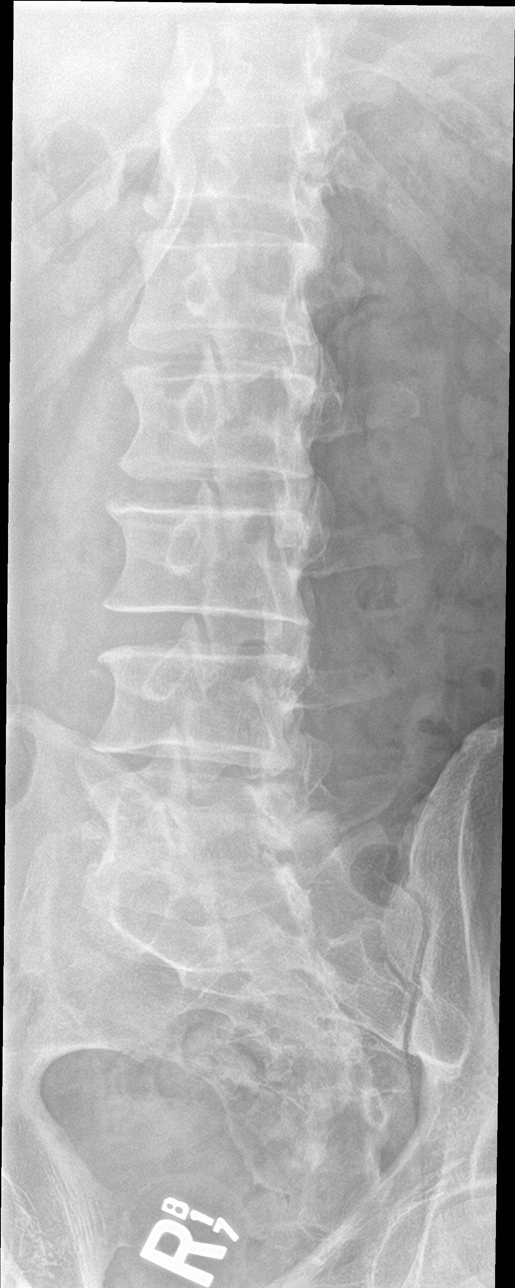

[l-spine obl (2 of 2)]
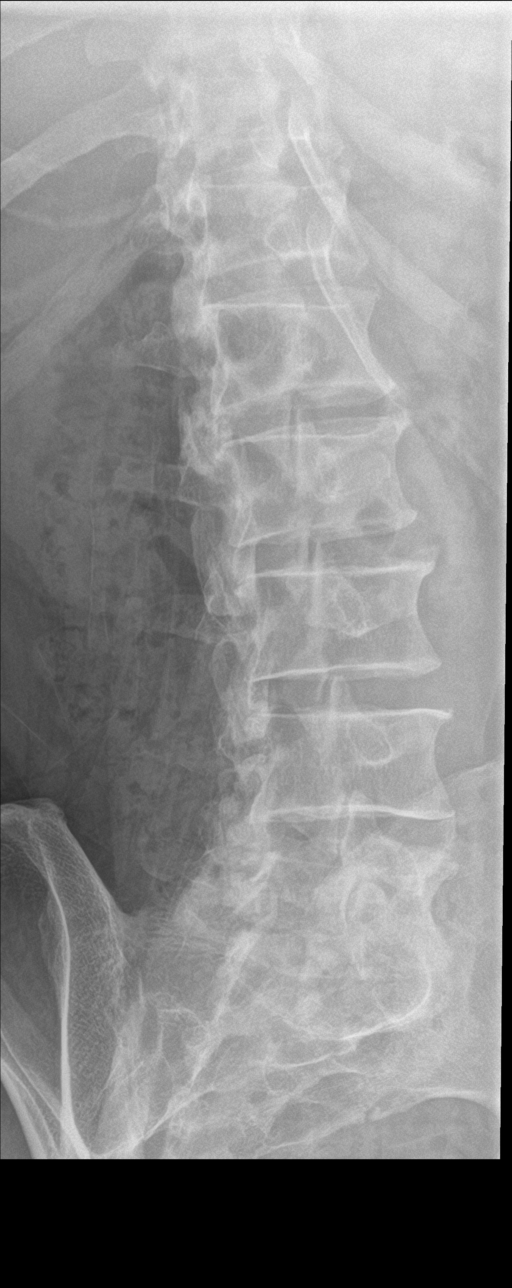

[l-spine lat]
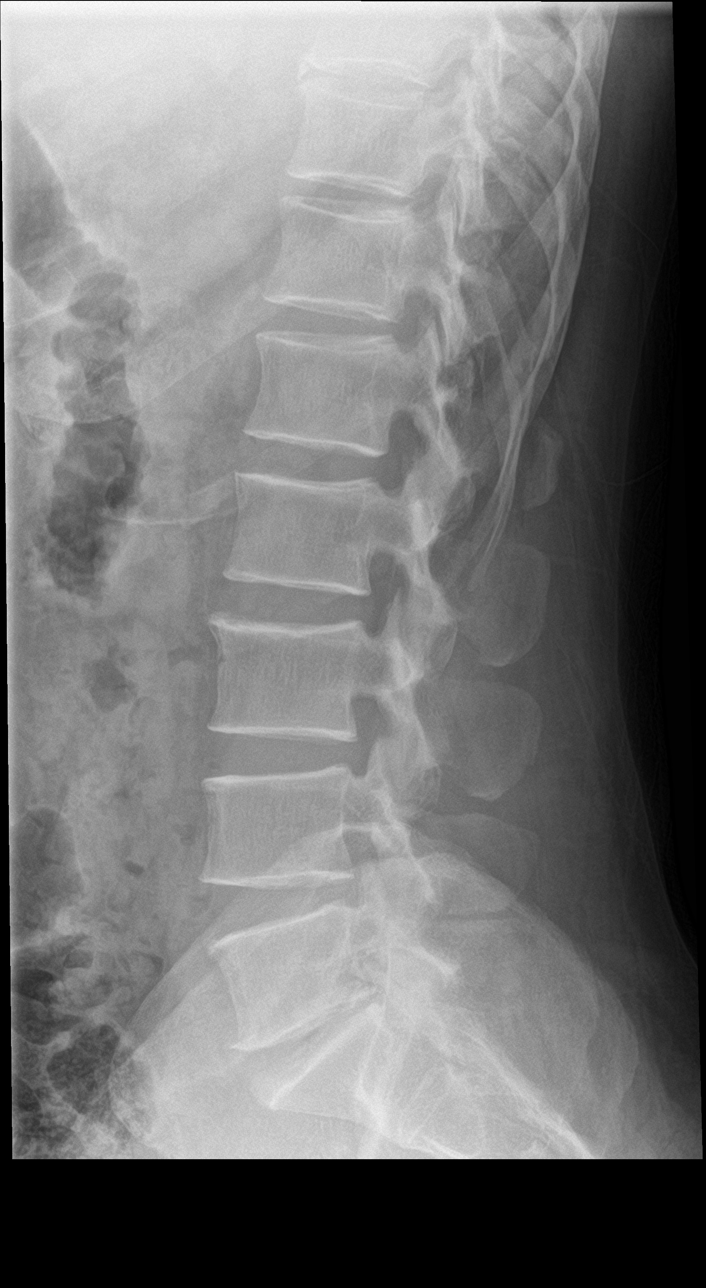

[l-spine spot]
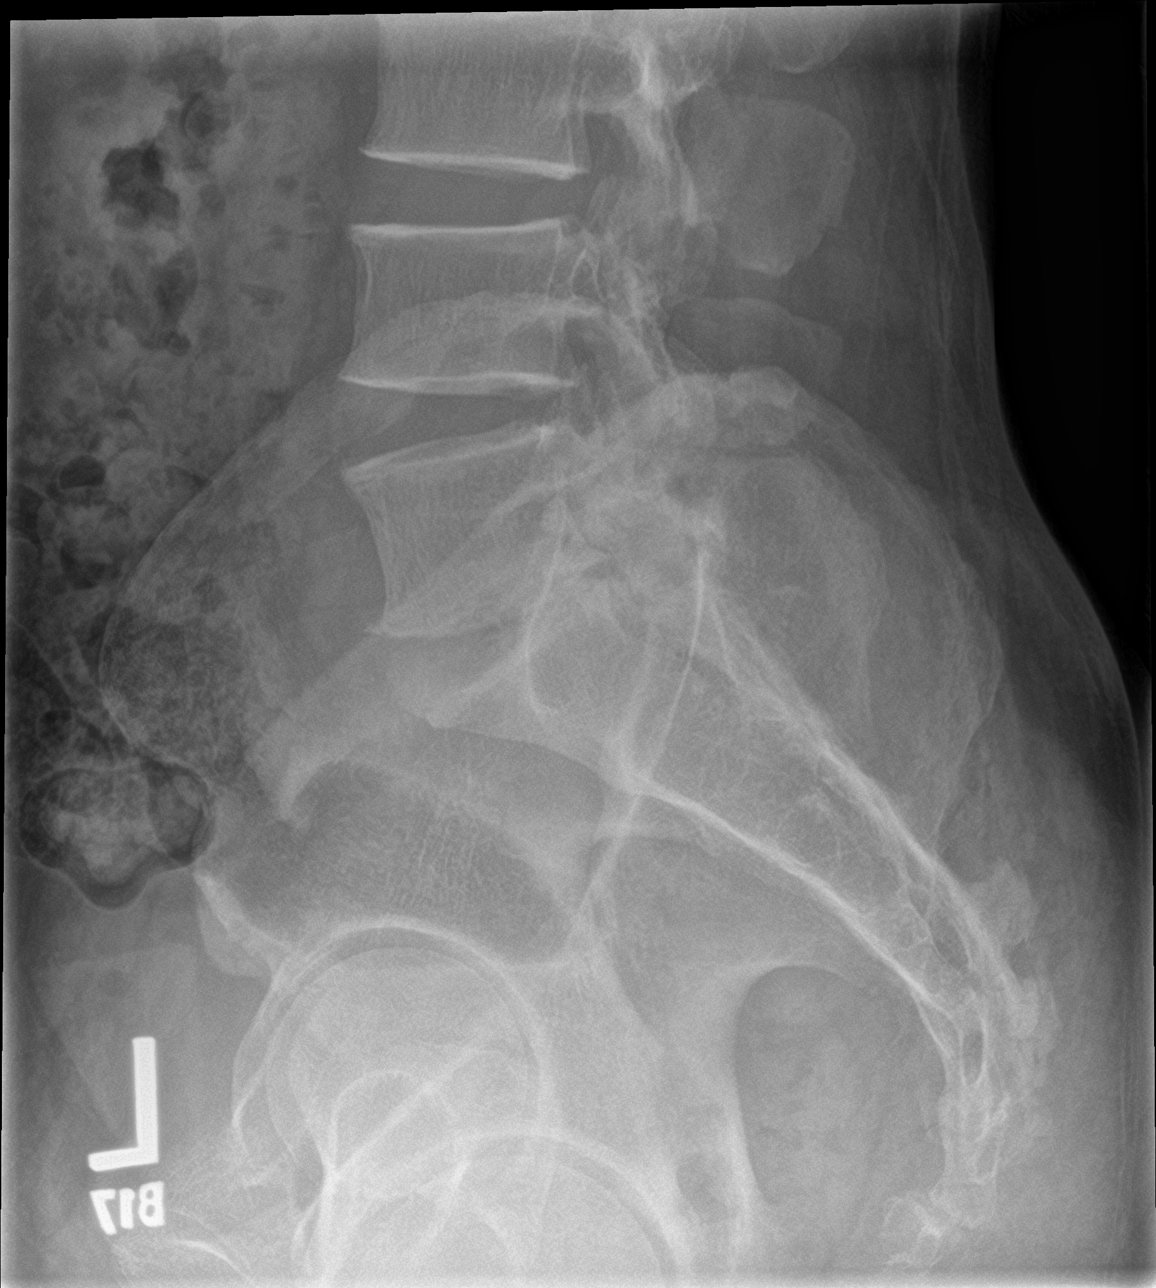

[5 of 5 positions shown; findings below may reference images not displayed]

FINDINGS: There are 5 non rib-bearing lumbar type vertebrae. Vertebral
alignment is normal. Mild-to-moderate disc space narrowing is
present at L5-S1 with associated endplate spurring. Minimal
spondylosis is again seen elsewhere in the lumbar spine with
preserved disc space heights. Subtle anterior wedging of the L4
vertebral body is unchanged and likely chronic. Moderate L5-S1 facet
arthrosis is noted and contributes to bilateral neural foraminal
stenosis on CT.
IMPRESSION: L5-S1 disc and facet degeneration without evidence of acute osseous
abnormality.

## 2022-03-09 ENCOUNTER — Ambulatory Visit: Payer: Self-pay | Admitting: Nurse Practitioner

## 2023-01-22 ENCOUNTER — Ambulatory Visit
Admission: EM | Admit: 2023-01-22 | Discharge: 2023-01-22 | Disposition: A | Discharge status: Home / Self Care | Payer: 59 | Attending: Emergency Medicine | Admitting: Emergency Medicine

## 2023-01-22 ENCOUNTER — Ambulatory Visit (INDEPENDENT_AMBULATORY_CARE_PROVIDER_SITE_OTHER): Payer: 59

## 2023-01-22 DIAGNOSIS — M25561 Pain in right knee: Secondary | ICD-10-CM

## 2023-01-22 MED ORDER — CYCLOBENZAPRINE HCL 10 MG PO TABS: 10.0000 mg | ORAL_TABLET | Freq: Every day | ORAL | 0 refills | Status: AC

## 2023-01-22 MED ORDER — KETOROLAC TROMETHAMINE 30 MG/ML IJ SOLN
30.0000 mg | Freq: Once | INTRAMUSCULAR | Status: AC
  Administered 2023-01-22: 30 mg via INTRAMUSCULAR

## 2023-01-22 MED ORDER — PREDNISONE 20 MG PO TABS: 40.0000 mg | ORAL_TABLET | Freq: Every day | ORAL | 0 refills | Status: AC

## 2023-01-22 NOTE — ED Triage Notes (Addendum)
Pain in right knee for 3 weeks. Pt states it is hard to walk on it, and getting muscle cramps in right thigh. Taking ibuprofen and tylenol.

## 2023-01-22 NOTE — Discharge Instructions (Signed)
La radiografa de la rodilla [employer] cambios naturales que ocurren con el envejecimiento  Le han administrado hoy aqu una inyeccin de Toradol que ayuda a reducir la inflamacin y, a su vez, idealmente debera comenzar a Corporate treasurer de su dolor en 30 minutos a una hora.  A partir de maana tome prednisona todas las maanas con alimentos para continuar con el proceso anterior, puede tomar Tylenol cada 6 horas segn sea necesario adems de este medicamento.  Puede usar Flexeril, que es un relajante muscular a la hora de [place], segn sea necesario para mayor comodidad; tenga en cuenta que este medicamento le dar sueo.  Aqu en la oficina se ha aplicado una compresin de rodilla, puede usarla segn sea necesario cada vez que complete una [place], esto proporciona estabilidad y apoyo con [address].  Puede usar una almohadilla trmica en intervalos de 15 minutos segn sea necesario para mayor comodidad, o puede sentirse cmodo usando hielo en 10 a 15 minutos sobre el rea afectada.  Comience a estirar el rea afectada diariamente durante 10 minutos segn lo tolere para aflojar an ms los msculos.   Cuando est acostado, coloque una almohada debajo y [place] rodillas para brindar apoyo.  Si el dolor persiste despus del tratamiento recomendado o reaparece, puede ser beneficioso realizar un seguimiento con un especialista en ortopedia para Stage manager, este mdico se especializa en los [employer] y Magazine features editor sus sntomas a Air cabin crew con opciones que [place], [place] otras, imgenes, medicamentos o fisioterapia.   X-ray of your knee shows natural changes that occur with aging  You have been given an injection of Toradol here today which helps to reduce inflammation in turn should ideally start to calm some of your pain down in 30 minutes to an hour  Starting tomorrow take prednisone every morning with food to continue the above process, you may take Tylenol every 6 hours as  needed in addition to this medicine  You may use Flexeril which is a muscle relaxant at bedtime as needed for additional comfort, be mindful of this medicine will make you sleepy  A knee compression has been applied here in the office, you may use as needed whenever completing activity, this provides stability and support with movement  You may use heating pad in 15 minute intervals as needed for additional comfort, or you may find comfort in using ice in 10-15 minutes over affected area  Begin stretching affected area daily for 10 minutes as tolerated to further loosen muscles   When lying down place pillow underneath and between knees for support  If pain persist after recommended treatment or reoccurs if may be beneficial to follow up with orthopedic specialist for evaluation, this doctor specializes in the bones and can manage your symptoms long-term with options such as but not limited to imaging, medications or physical therapy

## 2023-01-22 NOTE — ED Provider Notes (Signed)
[facility]    CSN: [id number] Arrival date & time: 01/22/23  1152      History   Chief Complaint Chief Complaint  Patient presents with   Knee Pain    HPI Lance Fields is a 57 y.o. male.   Patient presents for evaluation of anterior right-sided knee pain worse to the lateral and medial aspects present for 3 weeks.  Pain has been constant making it difficult to bear weight, intermittently radiating to the upper thigh causing cramping.  Pain to the sides of the knee are described as a pinching sensation and exacerbated by all movement such as standing and bending.  Denies injury or trauma, precipitating event or change in activity, numbness or tingling.  Has attempted use of Tylenol and ibuprofen which have been ineffective.      History reviewed. No pertinent past medical history.  There are no problems to display for this patient.   History reviewed. No pertinent surgical history.     Home Medications    Prior to Admission medications   Medication Sig Start Date End Date Taking? Authorizing Provider  traMADol (ULTRAM) 50 MG tablet Take 1 tablet (50 mg total) by mouth every 12 (twelve) hours as needed. 12/29/17   Joni Reining, PA-C    Family History History reviewed. No pertinent family history.  Social History Social History   Tobacco Use   Smoking status: Never   Smokeless tobacco: Never  Substance Use Topics   Alcohol use: No   Drug use: Never     Allergies   Patient has no known allergies.   Review of Systems Review of Systems   Physical Exam Triage Vital Signs ED Triage Vitals  Enc Vitals Group     BP 01/22/23 1257 (!) 145/83     Pulse Rate 01/22/23 1257 78     Resp 01/22/23 1257 18     Temp 01/22/23 1257 98.1 F (36.7 C)     Temp Source 01/22/23 1257 Oral     SpO2 01/22/23 1257 95 %     Weight --      Height --      Head Circumference --      Peak Flow --      Pain Score 01/22/23 1259 9     Pain Loc --       Pain Edu? --      Excl. in GC? --    No data found.  Updated Vital Signs BP (!) 145/83 (BP Location: Right Arm)   Pulse 78   Temp 98.1 F (36.7 C) (Oral)   Resp 18   SpO2 95%   Visual Acuity Right Eye Distance:   Left Eye Distance:   Bilateral Distance:    Right Eye Near:   Left Eye Near:    Bilateral Near:     Physical Exam Constitutional:      Appearance: Normal appearance.  Eyes:     Extraocular Movements: Extraocular movements intact.  Pulmonary:     Effort: Pulmonary effort is normal.  Musculoskeletal:     Comments: Generalized tenderness to the anterior right knee, point tenderness along the lateral aspect with mild generalized swelling, no ecchymosis or deformity, able to bear weight but pain is elicited, able to bend to 90 degree angle but pain is elicited, 2+ popliteal pulse, no ligament laxity  Neurological:     Mental Status: He is alert and oriented to person, place, and time. Mental status is at baseline.  UC Treatments / Results  Labs (all labs ordered are listed, but only abnormal results are displayed) Labs Reviewed - No data to display  EKG   Radiology DG Knee Complete 4 Views Right  Result Date: 01/22/2023 CLINICAL DATA:  Right knee pain for 3 weeks EXAM: RIGHT KNEE - COMPLETE 4 VIEW COMPARISON:  None Available. FINDINGS: No evidence of fracture, dislocation, or joint effusion. Mild degenerative changes of the lateral compartment consisting of minimal osteophyte formation. Joint spaces are relatively well-maintained. Soft tissues are unremarkable. IMPRESSION: No acute osseous abnormality. Mild degenerative changes of the lateral compartment. Electronically Signed   By: Allegra Lai M.D.   On: 01/22/2023 13:40    Procedures Procedures (including critical care time)  Medications Ordered in UC Medications - No data to display  Initial Impression / Assessment and Plan / UC Course  I have reviewed the triage vital signs and the nursing  notes.  Pertinent labs & imaging results that were available during my care of the patient were reviewed by me and considered in my medical decision making (see chart for details).  Acute Right knee pain  X-ray showing degenerative changes, discussed with patient, Toradol injection given in office and prednisone and Flexeril prescribed for outpatient management, compression applied by nursing staff to be used as needed when completing activity, recommended RICE, heat massage stretching and activity as tolerated, walking referral given to orthopedics if symptoms persist past medicine use  All translation completed by family, declined formal interpreter Final Clinical Impressions(s) / UC Diagnoses   Final diagnoses:  None   Discharge Instructions   None    ED Prescriptions   None    PDMP not reviewed this encounter.   Valinda Hoar, NP 01/22/23 1404
